# Patient Record
Sex: Female | Born: 1995 | Race: White | Hispanic: No | Smoking: Never smoker
Health system: Southern US, Community
[De-identification: ages and names within clinical notes are randomized; demographics above are authoritative.]

## PROBLEM LIST (undated history)

## (undated) DIAGNOSIS — F32A Depression, unspecified: Secondary | ICD-10-CM

## (undated) DIAGNOSIS — F329 Major depressive disorder, single episode, unspecified: Secondary | ICD-10-CM

## (undated) DIAGNOSIS — F419 Anxiety disorder, unspecified: Secondary | ICD-10-CM

## (undated) HISTORY — DX: Anxiety disorder, unspecified: F41.9

## (undated) HISTORY — DX: Depression, unspecified: F32.A

## (undated) HISTORY — DX: Major depressive disorder, single episode, unspecified: F32.9

## (undated) HISTORY — PX: NO PAST SURGERIES: SHX2092

---
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
## 2016-07-09 IMAGING — CR DG HAND COMPLETE 3+V*R*
1 series · 3 of 3 positions shown · non-contrast
Comparison: None.

CLINICAL DATA: Acute onset of right ring finger pain after jamming
finger while catching football. Initial encounter.

EXAM:
RIGHT HAND - COMPLETE 3+ VIEW

[Series 1: dg hand complete right · 0.14mm/px · 3 of 3 slices shown]
[im 1/3]
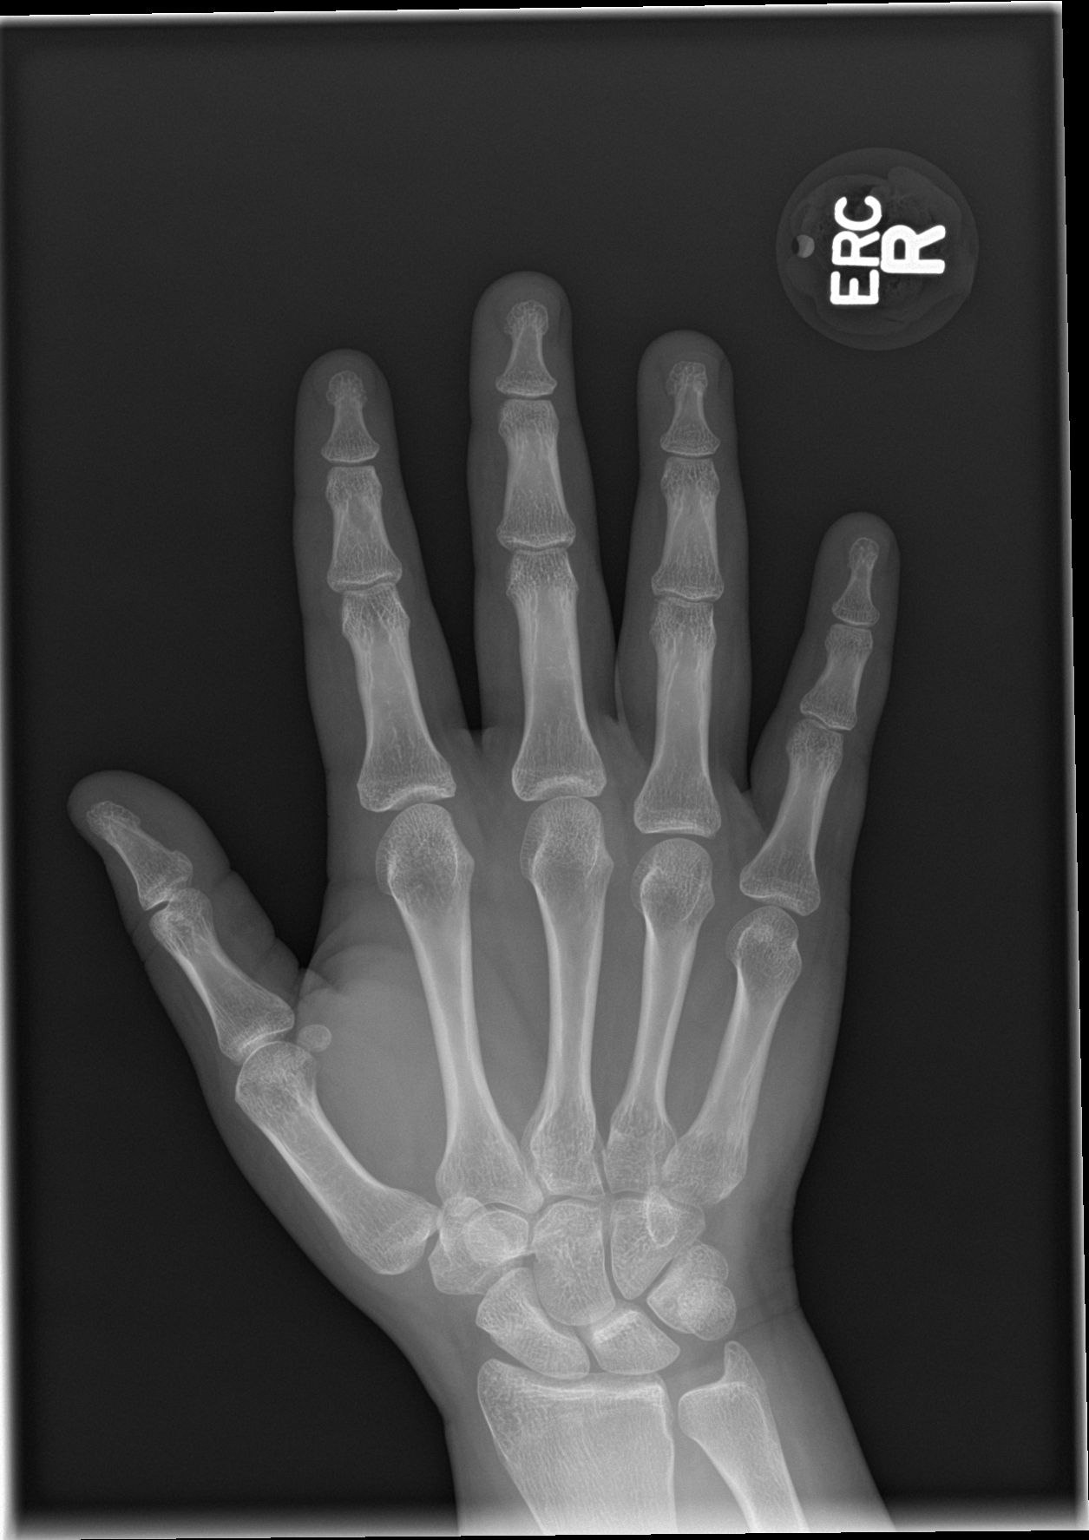
[im 2/3]
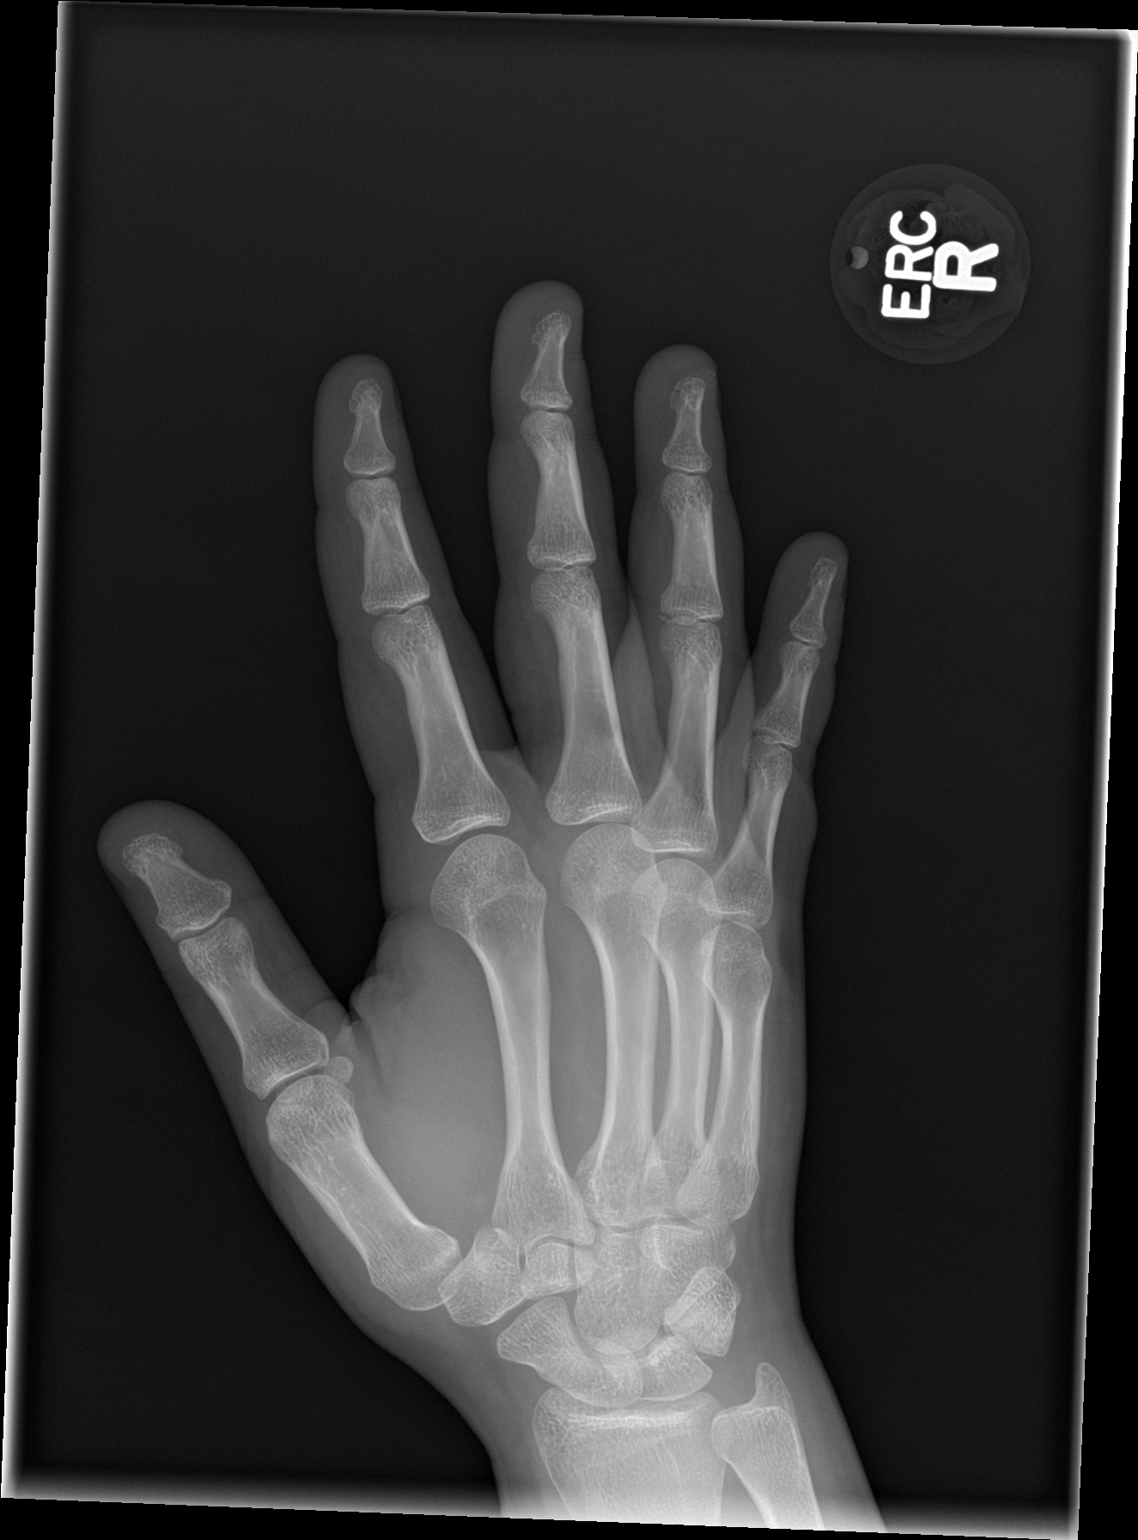
[im 3/3]
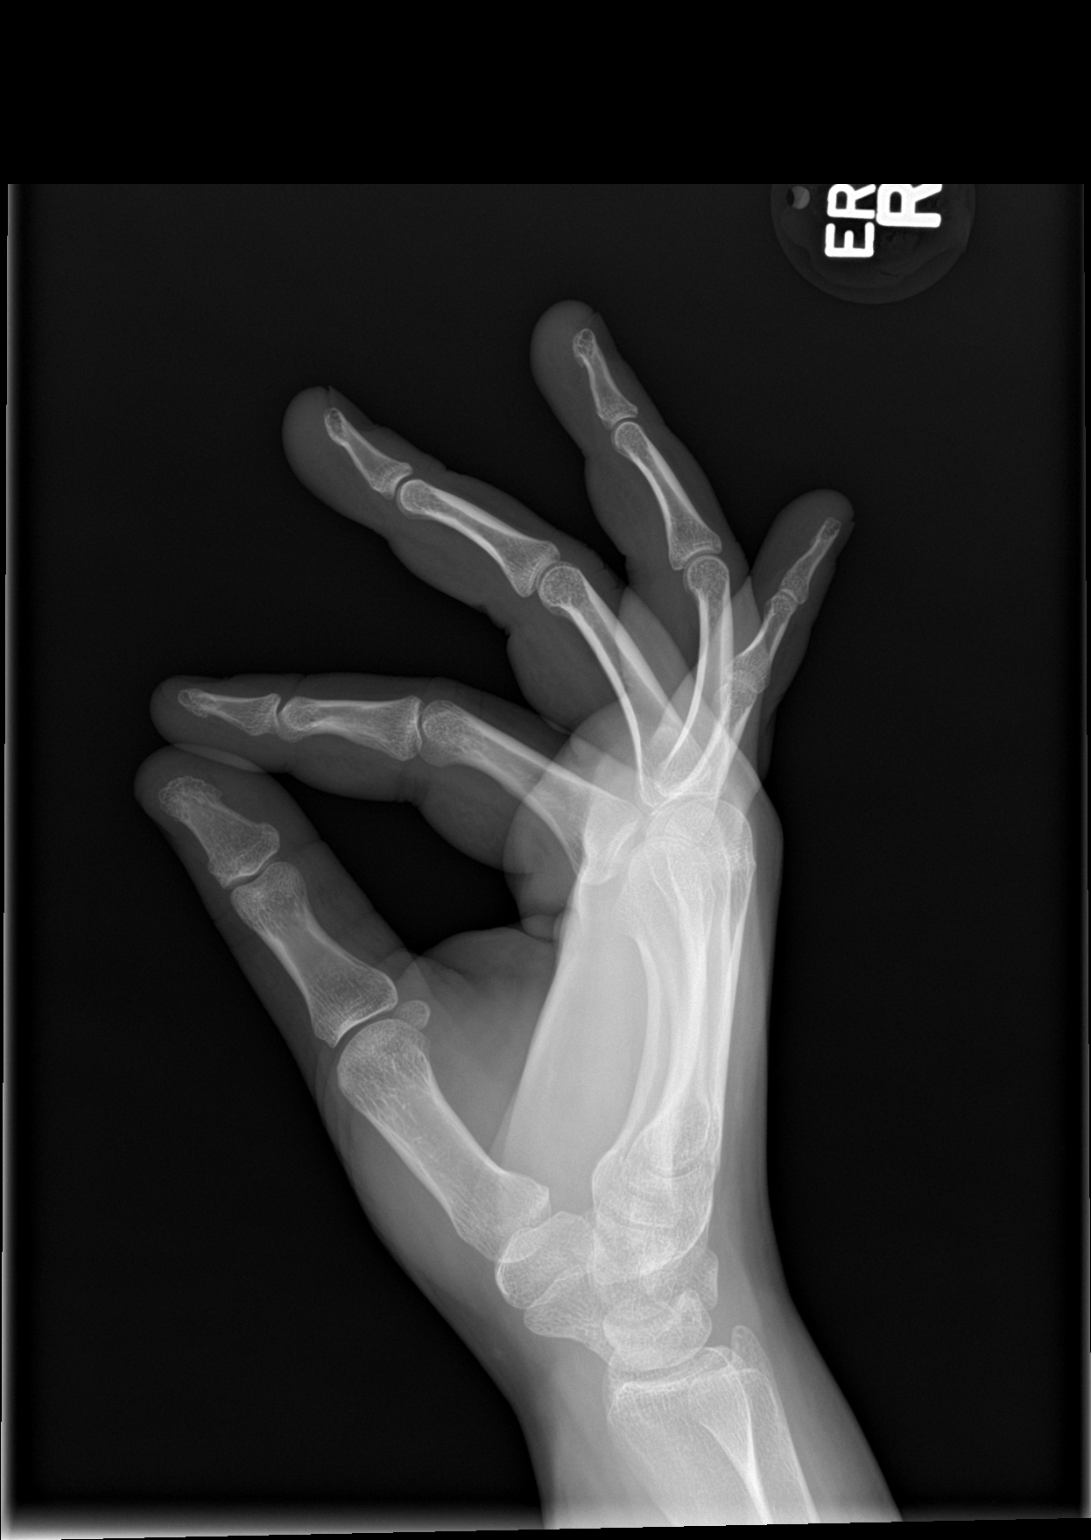

[3 of 3 positions shown; findings below may reference images not displayed]

FINDINGS: There appears to be a tiny avulsion fracture fragment arising at the
radial aspect of the base of the fourth middle phalanx, with mild
associated soft tissue swelling.

The joint spaces are otherwise preserved. The carpal rows are
intact, and demonstrate normal alignment. The soft tissues are
unremarkable in appearance.
IMPRESSION: Tiny avulsion fracture fragment arising at the radial aspect of the
base of the fourth middle phalanx.

## 2017-03-07 ENCOUNTER — Ambulatory Visit
Admission: EM | Admit: 2017-03-07 | Discharge: 2017-03-07 | Discharge status: Absent without leave | Payer: Managed Care, Other (non HMO)

## 2017-03-14 ENCOUNTER — Encounter: Payer: Self-pay | Admitting: Certified Nurse Midwife

## 2017-03-14 ENCOUNTER — Ambulatory Visit (INDEPENDENT_AMBULATORY_CARE_PROVIDER_SITE_OTHER): Payer: Managed Care, Other (non HMO) | Admitting: Certified Nurse Midwife

## 2017-03-14 VITALS — BP 110/70 | HR 79 | Wt 121.0 lb

## 2017-03-14 DIAGNOSIS — F419 Anxiety disorder, unspecified: Secondary | ICD-10-CM | POA: Insufficient documentation

## 2017-03-14 DIAGNOSIS — Z87898 Personal history of other specified conditions: Secondary | ICD-10-CM | POA: Insufficient documentation

## 2017-03-14 DIAGNOSIS — Z3401 Encounter for supervision of normal first pregnancy, first trimester: Secondary | ICD-10-CM

## 2017-03-14 DIAGNOSIS — F329 Major depressive disorder, single episode, unspecified: Secondary | ICD-10-CM | POA: Insufficient documentation

## 2017-03-14 DIAGNOSIS — F1291 Cannabis use, unspecified, in remission: Secondary | ICD-10-CM | POA: Insufficient documentation

## 2017-03-14 DIAGNOSIS — Z3A09 9 weeks gestation of pregnancy: Secondary | ICD-10-CM

## 2017-03-14 DIAGNOSIS — F32A Depression, unspecified: Secondary | ICD-10-CM | POA: Insufficient documentation

## 2017-03-14 DIAGNOSIS — Z8659 Personal history of other mental and behavioral disorders: Secondary | ICD-10-CM | POA: Insufficient documentation

## 2017-03-14 NOTE — Progress Notes (Signed)
Pt c/o nausea in the mornings. Takes zofran every morning. Pregnancy confirmed at an urgent care and pt also had +UPT at home.

## 2017-03-14 NOTE — Progress Notes (Signed)
New Obstetric Patient H&P    Chief Complaint: "Desires prenatal care"   History of Present Illness: Patient is a 22 y.o. G1P0 Caucasion female, LMP 01/10/2017 (+/-1 week) presents with amenorrhea and positive home pregnancy test. Based on her  LMP, her EDD is Estimated Date of Delivery: 10/17/17 and her EGA is [redacted]w[redacted]d. She was on OCPs until the end of November. She ran out of pills and was without contraception for weeks then resumed BCPs x 3 days, but she stopped when she became nauseous and had a positive pregnancy test 05 Mar 2017.  Since her LMP she claims she has experienced mild breast tenderness and nausea and vomiting. She vomits every morning. Was given a prescription for Zofran 4 mgm at the Urgent Care where she was diagnosed with pregnancy and that has been helping. She denies vaginal bleeding. Her past medical history is remarkable for a hospitalization after a suicide attempt when she was 22 years old following being bullied at school. She was treated for anxiety and depression until 1-2 years ago and also received counseling. She feels well now and denies feelings of depression or anxiety. No past history of abuse. This is her first pregnancy  Since her LMP, she admits to the use of tobacco products  No Since her LMP, she admits to the use of alcohol: no Since her LMP, she admits to the use of other substances: yes, marijuana. Stopped using with positive ICON. She claims she has gained   four pounds since the start of her pregnancy.  There are cats in the home in the home  yes If yes Indoor She admits close contact with children on a regular basis  no  She has had chicken pox in the past no. Did have immunization She has had Tuberculosis exposures, symptoms, or previously tested positive for TB   no Current or past history of domestic violence. no  Genetic Screening/Teratology Counseling: (Includes patient, baby's father, or anyone in either family with:)   1. Patient's age  >/= 84 at [facility]  no 2. Thalassemia ([place], [place], Mediterranean, or Asian background): MCV<80  no 3. Neural tube defect (meningomyelocele, spina bifida, anencephaly)  no 4. Congenital heart defect  no  5. Down syndrome  no 6. Tay-Sachs (Jewish, [place]  no 7. Canavan's Disease  no 8. Sickle cell disease or trait (African)  no  9. Hemophilia or other blood disorders  no  10. Muscular dystrophy  no  11. Cystic fibrosis  no  12. Huntington's Chorea  no  13. Mental retardation/autism  no 14. Other inherited genetic or chromosomal disorder  no 15. Maternal metabolic disorder (DM, PKU, etc)  no 16. Patient or FOB with a child with a birth defect not listed above no  16a. Patient or FOB with a birth defect themselves no 17. Recurrent pregnancy loss, or stillbirth  no  18. Any medications since LMP other than prenatal vitamins (include vitamins, supplements, OTC meds, drugs, alcohol)  Yes, Zofran 4 mgm and 3 OCPs 19. Any other genetic/environmental exposure to discuss  no  Infection History:   1. Lives with someone with TB or TB exposed  no  2. Patient or partner has history of genital herpes  no 3. Rash or viral illness since LMP  no 4. History of STI (GC, CT, HPV, syphilis, HIV)  no 5. History of recent travel :  No 6. Last tetanus vaccine: 1 year ago  Other pertinent information:  Yes, FOB  is Ulice Brilliant, 22 yo, who will be involved with her care    Review of Systems:10 point review of systems negative unless otherwise noted in HPI  Past Medical History:  Past Medical History:  Diagnosis Date  . Anxiety   . Depression    suicide attempt at age 46/ treated with medication and counseling until age 83.    Past Surgical History:  Past Surgical History:  Procedure Laterality Date  . NO PAST SURGERIES      Gynecologic History: Patient's last menstrual period was 01/10/2017 (lmp unknown).  Obstetric History: G1P0  Family History:  Family History  Problem Relation Age of  Onset  . Hypertension Father   . Depression Maternal Aunt   . Hypertension Paternal Aunt   . Hypertension Paternal Uncle   . Breast cancer Paternal Grandmother 35  . Hypertension Paternal Grandmother   . Hypertension Paternal Uncle   . Breast cancer Other   . Lung cancer Other   . Diabetes Neg Hx     Social History:  Social History   Socioeconomic History  . Marital status: Significant Other    Spouse name: Ulice Brilliant  . Number of children: 0  . Years of education: Not on file  . Highest education level: Not on file  Social Needs  . Financial resource strain: Not on file  . Food insecurity - worry: Not on file  . Food insecurity - inability: Not on file  . Transportation needs - medical: Not on file  . Transportation needs - non-medical: Not on file  Occupational History  . Occupation: Production assistant, radio at  [employer]  . Smoking status: Never Smoker  . Smokeless tobacco: Never Used  Substance and Sexual Activity  . Alcohol use: No  . Drug use: No  . Sexual activity: Yes    Partners: Male    Birth control/protection: None  Other Topics Concern  . Not on file  Social History Narrative   Server at [employer]    Allergies:  No Known Allergies  Medications: Prior to Admission medications   Medication Sig Start Date End Date Taking? Authorizing Provider  ondansetron (ZOFRAN) 4 MG tablet Take 4 mg by mouth every 8 (eight) hours as needed for nausea or vomiting.   Yes [provider]  Prenatal Vit-Fe Fumarate-FA (MULTIVITAMIN-PRENATAL) 27-0.8 MG TABS tablet Take 1 tablet by mouth daily at 12 noon.   Yes [provider]    Physical Exam Vitals: BP 110/70   Pulse 79   Wt 121 lb (54.9 kg)   LMP 01/10/2017 (LMP Unknown)   BMI 22.86 kg/m   General: WF in  NAD HEENT: normocephalic, PEARL, pierced nose, OP slightly inflamed and without exudates Thyroid: no enlargement, no palpable nodules Pulmonary: No increased work of breathing, CTAB Breasts:  symmetrical, increased firmness/density in upper breasts bilaterally but no dominant masses, no skin changes, nipples pierced bilaterally Cardiovascular: RRR, without murmur Abdomen: soft, non-tender, non-distended.  Umbilicus without lesions.  No hepatomegaly, splenomegaly or masses palpable. No evidence of hernia  Genitourinary:  External: Normal external female genitalia.  Normal urethral meatus, normal  Bartholin's and Skene's glands.    Vagina: Normal vaginal mucosa, no evidence of prolapse.    Cervix: posterior, mobile,NT, no bleeding  Uterus: AF, soft, enlarged to 8 week size, mobile, normal contour.   Adnexa: ovaries non-enlarged, no adnexal masses  Rectal: deferred Extremities: no edema, erythema, or tenderness Neurologic: Grossly intact Psychiatric: mood appropriate, affect full   Assessment: 22 y.o. G1P0 at [redacted]w[redacted]d  presenting to initiate prenatal care History of anxiety and depression Past use of marijuana  Plan: 1) Avoid alcoholic beverages. 2) Discontinue the use of all non-medicinal drugs and chemicals.  3) Take prenatal vitamins daily.  4) given samples of Bonjesta-to start with one tablet at hs. If effective, will RX Diclegis 5) Nutrition, food safety (fish, cheese advisories, and high nitrite foods) and exercise discussed. Given handouts on diet, activity, safe medications, avoiding use of substances. 6) Genetic Screening, such as with 1st Trimester Screening,  AFP testing, and Ultrasound, is discussed with patient. At the conclusion of today's visit patient is undecided about  genetic testing 7) Patient is asked about travel to areas at risk for the Zika virus, and counseled to avoid travel and exposure to mosquitoes or sexual partners who may have themselves been exposed to the virus. Testing is discussed, and will be ordered as appropriate.  8) Informal ultrasound done which revealed a SIUP with CRL of 9wk2days which confirms patient's dates. FM and normal FCA seen.  Questionable fibroid anteriorly. Scheduled for formal ultrasound next week and follow up. 9) NOB labs, urine culture, UDS, and APtima done  Farrel Connersolleen Shaunae Sieloff, CNM

## 2017-03-15 LAB — RPR+RH+ABO+RUB AB+AB SCR+CB...
ANTIBODY SCREEN: NEGATIVE
HEP B S AG: NEGATIVE
HIV SCREEN 4TH GENERATION: NONREACTIVE
Hematocrit: 39.9 % (ref 34.0–46.6)
Hemoglobin: 13.5 g/dL (ref 11.1–15.9)
MCH: 30.8 pg (ref 26.6–33.0)
MCHC: 33.8 g/dL (ref 31.5–35.7)
MCV: 91 fL (ref 79–97)
PLATELETS: 244 10*3/uL (ref 150–379)
RBC: 4.38 x10E6/uL (ref 3.77–5.28)
RDW: 13.5 % (ref 12.3–15.4)
RPR: NONREACTIVE
Rh Factor: POSITIVE
Rubella Antibodies, IGG: 2 index (ref >0.99)
Varicella zoster IgG: <135 index — ABNORMAL LOW (ref >165)
WBC: 4.7 10*3/uL (ref 3.4–10.8)

## 2017-03-16 ENCOUNTER — Encounter: Payer: Self-pay | Admitting: Certified Nurse Midwife

## 2017-03-16 DIAGNOSIS — Z3400 Encounter for supervision of normal first pregnancy, unspecified trimester: Secondary | ICD-10-CM | POA: Insufficient documentation

## 2017-03-20 ENCOUNTER — Telehealth: Payer: Self-pay | Admitting: Certified Nurse Midwife

## 2017-03-20 ENCOUNTER — Encounter: Payer: Self-pay | Admitting: Certified Nurse Midwife

## 2017-03-20 LAB — URINE DRUG PANEL 7
Amphetamines, Urine: NEGATIVE ng/mL
BARBITURATE QUANT UR: NEGATIVE ng/mL
BENZODIAZEPINE QUANT UR: NEGATIVE ng/mL
COCAINE (METAB.): NEGATIVE ng/mL
Cannabinoid Quant, Ur: POSITIVE — AB
OPIATE QUANT UR: NEGATIVE ng/mL
PCP Quant, Ur: NEGATIVE ng/mL

## 2017-03-20 LAB — CHLAMYDIA/GONOCOCCUS/TRICHOMONAS, NAA
Chlamydia by NAA: POSITIVE — AB
GONOCOCCUS BY NAA: NEGATIVE
TRICH VAG BY NAA: NEGATIVE

## 2017-03-20 LAB — URINE CULTURE

## 2017-03-20 MED ORDER — PROMETHAZINE HCL 25 MG PO TABS
ORAL_TABLET | ORAL | 0 refills | Status: AC
Start: 2017-03-20 — End: ?

## 2017-03-20 MED ORDER — AZITHROMYCIN 500 MG PO TABS: ORAL_TABLET | ORAL | 0 refills | Status: AC

## 2017-03-20 NOTE — Telephone Encounter (Signed)
Called patient regarding results of recent labs. Informed of positive Chlamydia NAAT. RX for Azithromycin called in. Recommend no IC until 7 days after both her and partner are treated. Also discussed MJ positive UDS-patient stated she stopped using. She told me that [place]jesta not helpful for nusea and vomiting. RX for Phenergan called in.

## 2017-03-20 NOTE — Telephone Encounter (Signed)
Patient called with lab results.

## 2017-03-28 ENCOUNTER — Ambulatory Visit (INDEPENDENT_AMBULATORY_CARE_PROVIDER_SITE_OTHER): Payer: Managed Care, Other (non HMO)

## 2017-03-28 ENCOUNTER — Ambulatory Visit (INDEPENDENT_AMBULATORY_CARE_PROVIDER_SITE_OTHER): Payer: Managed Care, Other (non HMO) | Admitting: Certified Nurse Midwife

## 2017-03-28 VITALS — BP 112/64 | Wt 121.0 lb

## 2017-03-28 DIAGNOSIS — Z3401 Encounter for supervision of normal first pregnancy, first trimester: Secondary | ICD-10-CM | POA: Diagnosis not present

## 2017-03-28 DIAGNOSIS — Z3A11 11 weeks gestation of pregnancy: Secondary | ICD-10-CM

## 2017-03-28 DIAGNOSIS — Z3400 Encounter for supervision of normal first pregnancy, unspecified trimester: Secondary | ICD-10-CM

## 2017-03-28 DIAGNOSIS — A749 Chlamydial infection, unspecified: Secondary | ICD-10-CM

## 2017-03-28 DIAGNOSIS — O98811 Other maternal infectious and parasitic diseases complicating pregnancy, first trimester: Secondary | ICD-10-CM

## 2017-03-28 NOTE — Progress Notes (Signed)
Dating scan today. No vb. No lof.  °

## 2017-04-03 DIAGNOSIS — A749 Chlamydial infection, unspecified: Secondary | ICD-10-CM | POA: Insufficient documentation

## 2017-04-03 DIAGNOSIS — O98811 Other maternal infectious and parasitic diseases complicating pregnancy, first trimester: Secondary | ICD-10-CM

## 2017-04-03 NOTE — Progress Notes (Signed)
ROB at 11 weeks after dating ultrasound.Unsure of LMP. CRL today 11wk6d. Will change EDC to 10/11/2017 to reflect today's ultrasound. Declines genetic testing. Took Azithromycin for Chlamydia. Will repeat test in 1 month. UDS at NOB positive for MJ also. States has stopped using. Needs refill on Phenergan. Most of N/V in AM. No weight loss over the past 2 weeks. P: ROB in 4 weeks. Repeat Aptima then Farrel Connersolleen Bernardo Brayman, CNM

## 2017-04-25 ENCOUNTER — Encounter: Payer: Self-pay | Admitting: Maternal Newborn

## 2017-04-25 ENCOUNTER — Ambulatory Visit (INDEPENDENT_AMBULATORY_CARE_PROVIDER_SITE_OTHER): Payer: Managed Care, Other (non HMO) | Admitting: Maternal Newborn

## 2017-04-25 VITALS — BP 120/72 | Wt 125.0 lb

## 2017-04-25 DIAGNOSIS — O09899 Supervision of other high risk pregnancies, unspecified trimester: Secondary | ICD-10-CM

## 2017-04-25 DIAGNOSIS — Z8759 Personal history of other complications of pregnancy, childbirth and the puerperium: Secondary | ICD-10-CM

## 2017-04-25 DIAGNOSIS — Z3400 Encounter for supervision of normal first pregnancy, unspecified trimester: Secondary | ICD-10-CM

## 2017-04-25 DIAGNOSIS — O09299 Supervision of pregnancy with other poor reproductive or obstetric history, unspecified trimester: Secondary | ICD-10-CM

## 2017-04-25 DIAGNOSIS — Z3A15 15 weeks gestation of pregnancy: Secondary | ICD-10-CM

## 2017-04-25 DIAGNOSIS — Z8619 Personal history of other infectious and parasitic diseases: Secondary | ICD-10-CM

## 2017-04-25 DIAGNOSIS — Z3689 Encounter for other specified antenatal screening: Secondary | ICD-10-CM

## 2017-04-25 NOTE — Progress Notes (Signed)
    Routine Prenatal Care Visit  Subjective  Brandi Valenzuela is a 22 y.o. G1P0 at 7635w6d being seen today for ongoing prenatal care.  She is currently monitored for the following issues for this low-risk pregnancy and has History of marijuana use; History of depression; History of anxiety; Supervision of normal first pregnancy, antepartum; and Chlamydia infection affecting pregnancy in first trimester on their problem list.  ----------------------------------------------------------------------------------- Patient reports nasal congestion.   Contractions: Not present. Vag. Bleeding: None.  Denies leaking of fluid.  ----------------------------------------------------------------------------------- The following portions of the patient's history were reviewed and updated as appropriate: allergies, current medications, past family history, past medical history, past social history, past surgical history and problem list. Problem list updated.   Objective  Blood pressure 120/72, weight 125 lb (56.7 kg), last menstrual period 01/10/2017. Pregravid weight 117 lb (53.1 kg) Total Weight Gain 8 lb (3.629 kg) Urinalysis: Urine Protein: Negative Urine Glucose: Negative  Fetal Status: Fetal Heart Rate (bpm): 151         General:  Alert, oriented and cooperative. Patient is in no acute distress.  Skin: Skin is warm and dry. No rash noted.   Cardiovascular: Normal heart rate noted  Respiratory: Normal respiratory effort, no problems with respiration noted  Abdomen: Soft, gravid, appropriate for gestational age. Pain/Pressure: Absent     Pelvic:  Cervical exam deferred        Extremities: Normal range of motion.  Edema: None  Mental Status: Normal mood and affect. Normal behavior. Normal judgment and thought content.     Assessment   22 y.o. G1P0 at 7135w6d, EDD 10/11/2017 by Ultrasound presenting for routine prenatal visit.  Plan   first pregnancy Problems (from 03/14/17 to present)    Problem  Noted Resolved   Supervision of normal first pregnancy, antepartum 03/16/2017 by Farrel ConnersGutierrez, Colleen, CNM No   Overview Signed 03/16/2017  2:08 PM by Farrel ConnersGutierrez, Colleen, CNM     Clinic  Prenatal Labs  Dating  Blood type:     Genetic Screen 1 Screen:    AFP:     Quad:     NIPS: Antibody:   Anatomic US  Rubella:    GTT Early:               Third trimester:  RPR:     Flu vaccine  HBsAg:     TDaP vaccine                                               Rhogam: HIV:     Baby Food                                               GBS: (For PCN allergy, check sensitivities)  Contraception  Pap:  Circumcision    Pediatrician    Support Person              Aptima collected today. Advised OTC medications to help with nasal congestion. Anatomy ultrasound ordered for next visit, gender a surprise.   Preterm labor symptoms and general obstetric precautions were reviewed with the patient.  Return in about 4 weeks (around 05/23/2017) for ROB and anatomy ultrasound.  Marcelyn BruinsJacelyn Schmid, CNM 04/25/2017  11:39 AM

## 2017-04-25 NOTE — Progress Notes (Signed)
C/o stuffy nose - adv plain sudafed, e.s. Tylenol - safe meds sheet given.  Pt thought she was to have another u/s today but doesn't know why - family is here too (in lobby).rj

## 2017-04-29 LAB — GC/CHLAMYDIA PROBE AMP
Chlamydia trachomatis, NAA: NEGATIVE
Neisseria gonorrhoeae by PCR: NEGATIVE

## 2017-05-23 ENCOUNTER — Ambulatory Visit (INDEPENDENT_AMBULATORY_CARE_PROVIDER_SITE_OTHER): Payer: Managed Care, Other (non HMO)

## 2017-05-23 ENCOUNTER — Ambulatory Visit (INDEPENDENT_AMBULATORY_CARE_PROVIDER_SITE_OTHER): Payer: Managed Care, Other (non HMO) | Admitting: Advanced Practice Midwife

## 2017-05-23 ENCOUNTER — Encounter: Payer: Self-pay | Admitting: Advanced Practice Midwife

## 2017-05-23 VITALS — BP 118/76 | Wt 132.0 lb

## 2017-05-23 DIAGNOSIS — Z3689 Encounter for other specified antenatal screening: Secondary | ICD-10-CM

## 2017-05-23 DIAGNOSIS — Z3A19 19 weeks gestation of pregnancy: Secondary | ICD-10-CM

## 2017-05-23 NOTE — Progress Notes (Signed)
No vb. No lof. U/s today.  

## 2017-05-23 NOTE — Progress Notes (Signed)
  Routine Prenatal Care Visit  Subjective  Brandi Valenzuela is a 22 y.o. G1P0 at 4256w6d being seen today for ongoing prenatal care.  She is currently monitored for the following issues for this low-risk pregnancy and has History of marijuana use; History of depression; History of anxiety; Supervision of normal first pregnancy, antepartum; and Chlamydia infection affecting pregnancy in first trimester on their problem list.  ----------------------------------------------------------------------------------- Patient reports no complaints.   Contractions: Not present. Vag. Bleeding: None.   . Denies leaking of fluid.  ----------------------------------------------------------------------------------- The following portions of the patient's history were reviewed and updated as appropriate: allergies, current medications, past family history, past medical history, past social history, past surgical history and problem list. Problem list updated.   Objective  Blood pressure 118/76, weight 132 lb (59.9 kg), last menstrual period 01/10/2017. Pregravid weight 117 lb (53.1 kg) Total Weight Gain 15 lb (6.804 kg) Urinalysis: Urine Protein: Negative Urine Glucose: Negative  Fetal Status: Fetal Heart Rate 150, movement present Anatomy scan today is complete and normal Gender surprise- until tomorrow!  General:  Alert, oriented and cooperative. Patient is in no acute distress.  Skin: Skin is warm and dry. No rash noted.   Cardiovascular: Normal heart rate noted  Respiratory: Normal respiratory effort, no problems with respiration noted  Abdomen: Soft, gravid, appropriate for gestational age. Pain/Pressure: Absent     Pelvic:  Cervical exam deferred        Extremities: Normal range of motion.     Mental Status: Normal mood and affect. Normal behavior. Normal judgment and thought content.   Assessment   22 y.o. G1P0 at 3456w6d by  10/11/2017, by Ultrasound presenting for routine prenatal visit  Plan    first pregnancy Problems (from 03/14/17 to present)    Problem Noted Resolved   Supervision of normal first pregnancy, antepartum 03/16/2017 by Farrel ConnersGutierrez, Colleen, CNM No   Overview Signed 03/16/2017  2:08 PM by Farrel ConnersGutierrez, Colleen, CNM     Clinic  Prenatal Labs  Dating  Blood type:     Genetic Screen 1 Screen:    AFP:     Quad:     NIPS: Antibody:   Anatomic US  Rubella:    GTT Early:               Third trimester:  RPR:     Flu vaccine  HBsAg:     TDaP vaccine                                               Rhogam: HIV:     Baby Food                                               GBS: (For PCN allergy, check sensitivities)  Contraception  Pap:  Circumcision    Pediatrician    Support Person Drake                Preterm labor symptoms and general obstetric precautions including but not limited to vaginal bleeding, contractions, leaking of fluid and fetal movement were reviewed in detail with the patient.   Return in about 1 month (around 06/20/2017) for rob.  Tresea MallJane Marcela Alatorre, CNM 05/23/2017 10:48 AM

## 2017-06-20 ENCOUNTER — Ambulatory Visit (INDEPENDENT_AMBULATORY_CARE_PROVIDER_SITE_OTHER): Payer: Managed Care, Other (non HMO) | Admitting: Maternal Newborn

## 2017-06-20 ENCOUNTER — Encounter: Payer: Self-pay | Admitting: Maternal Newborn

## 2017-06-20 VITALS — BP 112/68 | Wt 142.0 lb

## 2017-06-20 DIAGNOSIS — Z3400 Encounter for supervision of normal first pregnancy, unspecified trimester: Secondary | ICD-10-CM

## 2017-06-20 DIAGNOSIS — Z3A23 23 weeks gestation of pregnancy: Secondary | ICD-10-CM

## 2017-06-20 NOTE — Progress Notes (Signed)
    Routine Prenatal Care Visit  Subjective  Brandi Valenzuela is a 22 y.o. G1P0 at [redacted]w[redacted]d being seen today for ongoing prenatal care.  She is currently monitored for the following issues for this low-risk pregnancy and has History of marijuana use; History of depression; History of anxiety; Supervision of normal first pregnancy, antepartum; and Chlamydia infection affecting pregnancy in first trimester on their problem list.  ----------------------------------------------------------------------------------- Patient reports no complaints.   Contractions: Not present. Vag. Bleeding: None.  Movement: Present. Denies leaking of fluid.  ----------------------------------------------------------------------------------- The following portions of the patient's history were reviewed and updated as appropriate: allergies, current medications, past family history, past medical history, past social history, past surgical history and problem list. Problem list updated.   Objective  Blood pressure 112/68, weight 142 lb (64.4 kg), last menstrual period 01/10/2017. Pregravid weight 117 lb (53.1 kg) Total Weight Gain 25 lb (11.3 kg) Urinalysis: Urine Protein: Negative Urine Glucose: Negative  Fetal Status: Fetal Heart Rate (bpm): 152 Fundal Height: 23 cm Movement: Present     General:  Alert, oriented and cooperative. Patient is in no acute distress.  Skin: Skin is warm and dry. No rash noted.   Cardiovascular: Normal heart rate noted  Respiratory: Normal respiratory effort, no problems with respiration noted  Abdomen: Soft, gravid, appropriate for gestational age. Pain/Pressure: Absent     Pelvic:  Cervical exam deferred        Extremities: Normal range of motion.  Edema: None  Mental Status: Normal mood and affect. Normal behavior. Normal judgment and thought content.     Assessment   21 y.o. G1P0 at [redacted]w[redacted]d, EDD 10/11/2017 by Ultrasound presenting for routine prenatal visit.  Plan   first  pregnancy Problems (from 03/14/17 to present)    Problem Noted Resolved   Supervision of normal first pregnancy, antepartum 03/16/2017 by Farrel Conners, CNM No   Overview Signed 03/16/2017  2:08 PM by Farrel Conners, CNM     Clinic  Prenatal Labs  Dating  Blood type:     Genetic Screen 1 Screen:    AFP:     Quad:     NIPS: Antibody:   Anatomic [place]  Rubella:    GTT Early:               Third trimester:  RPR:     Flu vaccine  HBsAg:     TDaP vaccine                                               Rhogam: HIV:     Baby Food                                               GBS: (For PCN allergy, check sensitivities)  Contraception  Pap:  Circumcision    Pediatrician    Support Person               Preterm labor symptoms and general obstetric precautions including but not limited to vaginal bleeding, contractions, leaking of fluid and fetal movement were reviewed.  Return in about 1 month (around 07/18/2017) for ROB and GTT/28 week labs.  Marcelyn Bruins, CNM 06/20/2017  1:46 PM

## 2017-06-20 NOTE — Progress Notes (Signed)
No complaints

## 2017-07-18 ENCOUNTER — Other Ambulatory Visit: Payer: Managed Care, Other (non HMO)

## 2017-07-18 ENCOUNTER — Encounter: Payer: Managed Care, Other (non HMO) | Admitting: Obstetrics & Gynecology

## 2017-07-23 ENCOUNTER — Other Ambulatory Visit: Payer: Managed Care, Other (non HMO)

## 2017-07-23 ENCOUNTER — Encounter: Payer: Managed Care, Other (non HMO) | Admitting: Certified Nurse Midwife

## 2017-07-24 ENCOUNTER — Telehealth: Payer: Self-pay

## 2017-07-24 NOTE — Telephone Encounter (Signed)
Spoke w/pt. Advised of safe OTC meds for sinus/allergies. Pt TRC if s&s not improved or worsen within 7 days.

## 2017-07-24 NOTE — Telephone Encounter (Signed)
Pt reports a bad sore throat & stuffy nose. Pt inquiring about what meds are safe for her to take in pregnancy for this. [phone]#[phone]

## 2017-07-28 ENCOUNTER — Ambulatory Visit (INDEPENDENT_AMBULATORY_CARE_PROVIDER_SITE_OTHER): Payer: Managed Care, Other (non HMO) | Admitting: Maternal Newborn

## 2017-07-28 ENCOUNTER — Encounter: Payer: Self-pay | Admitting: Maternal Newborn

## 2017-07-28 ENCOUNTER — Other Ambulatory Visit: Payer: Managed Care, Other (non HMO)

## 2017-07-28 VITALS — BP 110/50 | Wt 157.0 lb

## 2017-07-28 DIAGNOSIS — Z3400 Encounter for supervision of normal first pregnancy, unspecified trimester: Secondary | ICD-10-CM

## 2017-07-28 DIAGNOSIS — Z3A29 29 weeks gestation of pregnancy: Secondary | ICD-10-CM

## 2017-07-28 NOTE — Patient Instructions (Signed)
Third Trimester of Pregnancy The third trimester is from week 28 through week 40 (months 7 through 9). The third trimester is a time when the unborn baby (fetus) is growing rapidly. At the end of the ninth month, the fetus is about 20 inches in length and weighs 6-10 pounds. Body changes during your third trimester Your body will continue to go through many changes during pregnancy. The changes vary from woman to woman. During the third trimester:  Your weight will continue to increase. You can expect to gain 25-35 pounds (11-16 kg) by the end of the pregnancy.  You may begin to get stretch marks on your hips, abdomen, and breasts.  You may urinate more often because the fetus is moving lower into your pelvis and pressing on your bladder.  You may develop or continue to have heartburn. This is caused by increased hormones that slow down muscles in the digestive tract.  You may develop or continue to have constipation because increased hormones slow digestion and cause the muscles that push waste through your intestines to relax.  You may develop hemorrhoids. These are swollen veins (varicose veins) in the rectum that can itch or be painful.  You may develop swollen, bulging veins (varicose veins) in your legs.  You may have increased body aches in the pelvis, back, or thighs. This is due to weight gain and increased hormones that are relaxing your joints.  You may have changes in your hair. These can include thickening of your hair, rapid growth, and changes in texture. Some women also have hair loss during or after pregnancy, or hair that feels dry or thin. Your hair will most likely return to normal after your baby is born.  Your breasts will continue to grow and they will continue to become tender. A yellow fluid (colostrum) may leak from your breasts. This is the first milk you are producing for your baby.  Your belly button may stick out.  You may notice more swelling in your hands,  face, or ankles.  You may have increased tingling or numbness in your hands, arms, and legs. The skin on your belly may also feel numb.  You may feel short of breath because of your expanding uterus.  You may have more problems sleeping. This can be caused by the size of your belly, increased need to urinate, and an increase in your body's metabolism.  You may notice the fetus "dropping," or moving lower in your abdomen (lightening).  You may have increased vaginal discharge.  You may notice your joints feel loose and you may have pain around your pelvic bone.  What to expect at prenatal visits You will have prenatal exams every 2 weeks until week 36. Then you will have weekly prenatal exams. During a routine prenatal visit:  You will be weighed to make sure you and the baby are growing normally.  Your blood pressure will be taken.  Your abdomen will be measured to track your baby's growth.  The fetal heartbeat will be listened to.  Any test results from the previous visit will be discussed.  You may have a cervical check near your due date to see if your cervix has softened or thinned (effaced).  You will be tested for Group B streptococcus. This happens between 35 and 37 weeks.  Your health care provider may ask you:  What your birth plan is.  How you are feeling.  If you are feeling the baby move.  If you have had   any abnormal symptoms, such as leaking fluid, bleeding, severe headaches, or abdominal cramping.  If you are using any tobacco products, including cigarettes, chewing tobacco, and electronic cigarettes.  If you have any questions.  Other tests or screenings that may be performed during your third trimester include:  Blood tests that check for low iron levels (anemia).  Fetal testing to check the health, activity level, and growth of the fetus. Testing is done if you have certain medical conditions or if there are problems during the  pregnancy.  Nonstress test (NST). This test checks the health of your baby to make sure there are no signs of problems, such as the baby not getting enough oxygen. During this test, a belt is placed around your belly. The baby is made to move, and its heart rate is monitored during movement.  What is false labor? False labor is a condition in which you feel small, irregular tightenings of the muscles in the womb (contractions) that usually go away with rest, changing position, or drinking water. These are called Braxton Hicks contractions. Contractions may last for hours, days, or even weeks before true labor sets in. If contractions come at regular intervals, become more frequent, increase in intensity, or become painful, you should see your health care provider. What are the signs of labor?  Abdominal cramps.  Regular contractions that start at 10 minutes apart and become stronger and more frequent with time.  Contractions that start on the top of the uterus and spread down to the lower abdomen and back.  Increased pelvic pressure and dull back pain.  A watery or bloody mucus discharge that comes from the vagina.  Leaking of amniotic fluid. This is also known as your "water breaking." It could be a slow trickle or a gush. Let your health care provider know if it has a color or strange odor. If you have any of these signs, call your health care provider right away, even if it is before your due date. Follow these instructions at home: Medicines  Follow your health care provider's instructions regarding medicine use. Specific medicines may be either safe or unsafe to take during pregnancy.  Take a prenatal vitamin that contains at least 600 micrograms (mcg) of folic acid.  If you develop constipation, try taking a stool softener if your health care provider approves. Eating and drinking  Eat a balanced diet that includes fresh fruits and vegetables, whole grains, good sources of protein  such as meat, eggs, or tofu, and low-fat dairy. Your health care provider will help you determine the amount of weight gain that is right for you.  Avoid raw meat and uncooked cheese. These carry germs that can cause birth defects in the baby.  If you have low calcium intake from food, talk to your health care provider about whether you should take a daily calcium supplement.  Eat four or five small meals rather than three large meals a day.  Limit foods that are high in fat and processed sugars, such as fried and sweet foods.  To prevent constipation: ? Drink enough fluid to keep your urine clear or pale yellow. ? Eat foods that are high in fiber, such as fresh fruits and vegetables, whole grains, and beans. Activity  Exercise only as directed by your health care provider. Most women can continue their usual exercise routine during pregnancy. Try to exercise for 30 minutes at least 5 days a week. Stop exercising if you experience uterine contractions.  Avoid heavy   lifting.  Do not exercise in extreme heat or humidity, or at high altitudes.  Wear low-heel, comfortable shoes.  Practice good posture.  You may continue to have sex unless your health care provider tells you otherwise. Relieving pain and discomfort  Take frequent breaks and rest with your legs elevated if you have leg cramps or low back pain.  Take warm sitz baths to soothe any pain or discomfort caused by hemorrhoids. Use hemorrhoid cream if your health care provider approves.  Wear a good support bra to prevent discomfort from breast tenderness.  If you develop varicose veins: ? Wear support pantyhose or compression stockings as told by your healthcare provider. ? Elevate your feet for 15 minutes, 3-4 times a day. Prenatal care  Write down your questions. Take them to your prenatal visits.  Keep all your prenatal visits as told by your health care provider. This is important. Safety  Wear your seat belt at  all times when driving.  Make a list of emergency phone numbers, including numbers for family, friends, the hospital, and police and fire departments. General instructions  Avoid cat litter boxes and soil used by cats. These carry germs that can cause birth defects in the baby. If you have a cat, ask someone to clean the litter box for you.  Do not travel far distances unless it is absolutely necessary and only with the approval of your health care provider.  Do not use hot tubs, steam rooms, or saunas.  Do not drink alcohol.  Do not use any products that contain nicotine or tobacco, such as cigarettes and e-cigarettes. If you need help quitting, ask your health care provider.  Do not use any medicinal herbs or unprescribed drugs. These chemicals affect the formation and growth of the baby.  Do not douche or use tampons or scented sanitary pads.  Do not cross your legs for long periods of time.  To prepare for the arrival of your baby: ? Take prenatal classes to understand, practice, and ask questions about labor and delivery. ? Make a trial run to the hospital. ? Visit the hospital and tour the maternity area. ? Arrange for maternity or paternity leave through employers. ? Arrange for family and friends to take care of pets while you are in the hospital. ? Purchase a rear-facing car seat and make sure you know how to install it in your car. ? Pack your hospital bag. ? Prepare the baby's nursery. Make sure to remove all pillows and stuffed animals from the baby's crib to prevent suffocation.  Visit your dentist if you have not gone during your pregnancy. Use a soft toothbrush to brush your teeth and be gentle when you floss. Contact a health care provider if:  You are unsure if you are in labor or if your water has broken.  You become dizzy.  You have mild pelvic cramps, pelvic pressure, or nagging pain in your abdominal area.  You have lower back pain.  You have persistent  nausea, vomiting, or diarrhea.  You have an unusual or bad smelling vaginal discharge.  You have pain when you urinate. Get help right away if:  Your water breaks before 37 weeks.  You have regular contractions less than 5 minutes apart before 37 weeks.  You have a fever.  You are leaking fluid from your vagina.  You have spotting or bleeding from your vagina.  You have severe abdominal pain or cramping.  You have rapid weight loss or weight gain.    You have shortness of breath with chest pain.  You notice sudden or extreme swelling of your face, hands, ankles, feet, or legs.  Your baby makes fewer than 10 movements in 2 hours.  You have severe headaches that do not go away when you take medicine.  You have vision changes. Summary  The third trimester is from week 28 through week 40, months 7 through 9. The third trimester is a time when the unborn baby (fetus) is growing rapidly.  During the third trimester, your discomfort may increase as you and your baby continue to gain weight. You may have abdominal, leg, and back pain, sleeping problems, and an increased need to urinate.  During the third trimester your breasts will keep growing and they will continue to become tender. A yellow fluid (colostrum) may leak from your breasts. This is the first milk you are producing for your baby.  False labor is a condition in which you feel small, irregular tightenings of the muscles in the womb (contractions) that eventually go away. These are called Braxton Hicks contractions. Contractions may last for hours, days, or even weeks before true labor sets in.  Signs of labor can include: abdominal cramps; regular contractions that start at 10 minutes apart and become stronger and more frequent with time; watery or bloody mucus discharge that comes from the vagina; increased pelvic pressure and dull back pain; and leaking of amniotic fluid. This information is not intended to replace advice  given to you by your health care provider. Make sure you discuss any questions you have with your health care provider. Document Released: 01/22/2001 Document Revised: 07/06/2015 Document Reviewed: 03/31/2012 Elsevier Interactive Patient Education  2017 [employer]  

## 2017-07-28 NOTE — Progress Notes (Signed)
Routine Prenatal Care Visit  Subjective  Brandi Valenzuela is a 22 y.o. G1P0 at 2273w2d being seen today for ongoing prenatal care.  She is currently monitored for the following issues for this low-risk pregnancy and has History of marijuana use; History of depression; History of anxiety; Supervision of normal first pregnancy, antepartum; and Chlamydia infection affecting pregnancy in first trimester on their problem list.  ----------------------------------------------------------------------------------- Patient reports no complaints.   Contractions: Not present. Vag. Bleeding: None.  Movement: Present. No leaking of fluid.  ----------------------------------------------------------------------------------- The following portions of the patient's history were reviewed and updated as appropriate: allergies, current medications, past family history, past medical history, past social history, past surgical history and problem list. Problem list updated.   Objective  Blood pressure (!) 110/50, weight 157 lb (71.2 kg), last menstrual period 01/10/2017. Pregravid weight 117 lb (53.1 kg) Total Weight Gain 40 lb (18.1 kg) Urinalysis: Urine Protein: Trace Urine Glucose: Negative  Fetal Status: Fetal Heart Rate (bpm): 147 Fundal Height: 29 cm Movement: Present     General:  Alert, oriented and cooperative. Patient is in no acute distress.  Skin: Skin is warm and dry. No rash noted.   Cardiovascular: Normal heart rate noted  Respiratory: Normal respiratory effort, no problems with respiration noted  Abdomen: Soft, gravid, appropriate for gestational age. Pain/Pressure: Absent     Pelvic:  Cervical exam deferred        Extremities: Normal range of motion.  Edema: None  Mental Status: Normal mood and affect. Normal behavior. Normal judgment and thought content.     Assessment   22 y.o. G1P0 at 7273w2d, EDD 10/11/2017 by Ultrasound presenting for routine prenatal visit.  Plan   first pregnancy  Problems (from 03/14/17 to present)    Problem Noted Resolved   Supervision of normal first pregnancy, antepartum 03/16/2017 by Farrel ConnersGutierrez, Colleen, CNM No   Overview Signed 03/16/2017  2:08 PM by Farrel ConnersGutierrez, Colleen, CNM     Clinic  Prenatal Labs  Dating  Blood type:     Genetic Screen 1 Screen:    AFP:     Quad:     NIPS: Antibody:   Anatomic [place]  Rubella:    GTT Early:               Third trimester:  RPR:     Flu vaccine  HBsAg:     TDaP vaccine                                               Rhogam: HIV:     Baby Food                                               GBS: (For PCN allergy, check sensitivities)  Contraception  Pap:  Circumcision    Pediatrician    Support Person                Preterm labor symptoms and general obstetric precautions including but not limited to vaginal bleeding, contractions, leaking of fluid and fetal movement were reviewed.  Please refer to After Visit Summary for other counseling recommendations.   Return in about 2 weeks (around 08/11/2017) for ROB.  Marcelyn BruinsJacelyn Chord Takahashi, CNM 07/28/2017  10:32  AM

## 2017-07-28 NOTE — Progress Notes (Signed)
C/o cold sxs - stuffy nose.rj

## 2017-07-29 LAB — 28 WEEK RH+PANEL
BASOS ABS: 0 10*3/uL (ref 0.0–0.2)
Basos: 0 %
EOS (ABSOLUTE): 0.1 10*3/uL (ref 0.0–0.4)
Eos: 1 %
Gestational Diabetes Screen: 79 mg/dL (ref 65–139)
HIV SCREEN 4TH GENERATION: NONREACTIVE
Hematocrit: 31.7 % — ABNORMAL LOW (ref 34.0–46.6)
Hemoglobin: 10.7 g/dL — ABNORMAL LOW (ref 11.1–15.9)
IMMATURE GRANS (ABS): 0.1 10*3/uL (ref 0.0–0.1)
IMMATURE GRANULOCYTES: 1 %
Lymphocytes Absolute: 1.2 10*3/uL (ref 0.7–3.1)
Lymphs: 18 %
MCH: 30.4 pg (ref 26.6–33.0)
MCHC: 33.8 g/dL (ref 31.5–35.7)
MCV: 90 fL (ref 79–97)
MONOCYTES: 10 %
Monocytes Absolute: 0.6 10*3/uL (ref 0.1–0.9)
NEUTROS PCT: 70 %
Neutrophils Absolute: 4.5 10*3/uL (ref 1.4–7.0)
PLATELETS: 215 10*3/uL (ref 150–450)
RBC: 3.52 x10E6/uL — ABNORMAL LOW (ref 3.77–5.28)
RDW: 12.6 % (ref 12.3–15.4)
RPR Ser Ql: NONREACTIVE
WBC: 6.5 10*3/uL (ref 3.4–10.8)

## 2017-08-13 ENCOUNTER — Telehealth: Payer: Self-pay

## 2017-08-13 NOTE — Telephone Encounter (Signed)
Pt has been experiencing cramping all day. She noticed some blood on the tisuue. [phone]#[phone]

## 2017-08-13 NOTE — Telephone Encounter (Signed)
Pt advised to report to L&D. No openings here this late in the day.

## 2018-01-03 ENCOUNTER — Encounter (HOSPITAL_COMMUNITY): Payer: Self-pay
